# Patient Record
Sex: Female | Born: 1975 | Race: White | Hispanic: No | Marital: Single | Smoking: Current every day smoker
Health system: Southern US, Community
[De-identification: ages and names within clinical notes are randomized; demographics above are authoritative.]

---

## 2018-03-27 ENCOUNTER — Ambulatory Visit: Payer: Self-pay | Admitting: "Endocrinology

## 2018-05-19 ENCOUNTER — Ambulatory Visit (INDEPENDENT_AMBULATORY_CARE_PROVIDER_SITE_OTHER): Payer: Self-pay | Admitting: "Endocrinology

## 2018-05-19 ENCOUNTER — Encounter: Payer: Self-pay | Admitting: "Endocrinology

## 2018-05-19 ENCOUNTER — Other Ambulatory Visit: Payer: Self-pay | Admitting: "Endocrinology

## 2018-05-19 ENCOUNTER — Other Ambulatory Visit: Payer: Self-pay

## 2018-05-19 VITALS — BP 118/76 | HR 79 | Ht 64.0 in | Wt 148.0 lb

## 2018-05-19 DIAGNOSIS — R7989 Other specified abnormal findings of blood chemistry: Secondary | ICD-10-CM

## 2018-05-19 NOTE — Progress Notes (Signed)
Endocrinology Consult Note                                            05/19/2018, 1:12 PM   Subjective:    Patient ID: Wendy Mckee, female    DOB: 31-Dec-1975, PCP Liam RogersAdkins, Neal A, MD   History reviewed. No pertinent past medical history. History reviewed. No pertinent surgical history. Social History   Socioeconomic History  . Marital status: Single    Spouse name: Not on file  . Number of children: Not on file  . Years of education: Not on file  . Highest education level: Not on file  Occupational History  . Not on file  Social Needs  . Financial resource strain: Not on file  . Food insecurity:    Worry: Not on file    Inability: Not on file  . Transportation needs:    Medical: Not on file    Non-medical: Not on file  Tobacco Use  . Smoking status: Current Every Day Smoker    Packs/day: 2.00    Years: 33.00    Pack years: 66.00    Types: Cigarettes  . Smokeless tobacco: Never Used  Substance and Sexual Activity  . Alcohol use: Yes    Comment: ocassional  . Drug use: Yes    Types: Marijuana  . Sexual activity: Not on file  Lifestyle  . Physical activity:    Days per week: Not on file    Minutes per session: Not on file  . Stress: Not on file  Relationships  . Social connections:    Talks on phone: Not on file    Gets together: Not on file    Attends religious service: Not on file    Active member of club or organization: Not on file    Attends meetings of clubs or organizations: Not on file    Relationship status: Not on file  Other Topics Concern  . Not on file  Social History Narrative  . Not on file   Family History  Problem Relation Age of Onset  . Diabetes Mother   . Diabetes Father   . Hypertension Father    Outpatient Encounter Medications as of 05/19/2018  Medication Sig  . citalopram (CELEXA) 10 MG tablet Take 10 mg by mouth daily.   No facility-administered encounter medications on file as of 05/19/2018.     ALLERGIES: Allergies  Allergen Reactions  . Penicillins     VACCINATION STATUS:  There is no immunization history on file for this patient.  HPI Wendy Mckee is 43 y.o. female who presents today with a medical history as above. she is being seen in consultation for abnormally suppressed TSH of < 0.006 requested by Liam RogersAdkins, Neal A, MD.  -She denies palpitations, tremors, heat/cold intolerance.  She denies any prior history of thyroid dysfunction.  She has distant cousins with an identified thyroid problem.  She has no family history of thyroid malignancy.  Patient is uninsured and worries about cost of medical care. -She denies recent weight change. Review of Systems  Constitutional: no recent weight gain/loss, + fatigue, no subjective hyperthermia, no subjective hypothermia Eyes: no blurry vision, no xerophthalmia ENT: no sore throat, no nodules palpated in throat, no dysphagia/odynophagia, no hoarseness Cardiovascular: no Chest Pain, no Shortness of Breath, no palpitations, no leg swelling Respiratory: no cough,  no shortness of breath Gastrointestinal: no Nausea/Vomiting/Diarhhea Musculoskeletal: no muscle/joint aches Skin: no rashes Neurological: no tremors, no numbness, no tingling, no dizziness Psychiatric: + depression, no anxiety  Objective:    BP 118/76   Pulse 79   Ht 5\' 4"  (1.626 m)   Wt 148 lb (67.1 kg)   BMI 25.40 kg/m   Wt Readings from Last 3 Encounters:  05/19/18 148 lb (67.1 kg)    Physical Exam  Constitutional: + appropriate weight for height, not in acute distress, normal state of mind Eyes: PERRLA, EOMI, no exophthalmos ENT: moist mucous membranes, + palpable  thyromegaly, no gross cervical lymphadenopathy Cardiovascular: normal precordial activity, Regular Rate and Rhythm, no Murmur/Rubs/Gallops Respiratory:  adequate breathing efforts, no gross chest deformity, Clear to auscultation bilaterally Gastrointestinal: abdomen soft, Non -tender, No  distension, Bowel Sounds present, no gross organomegaly Musculoskeletal: no gross deformities, strength intact in all four extremities Skin: moist, warm, no rashes Neurological: no tremor with outstretched hands, Deep tendon reflexes normal in bilateral lower extremities.   February 11, 2018 TSH <0.006 Assessment & Plan:   1. Abnormal thyroid blood test - [name]  is being seen at a kind request of Liam Rogers, MD. - I have reviewed her available thyroid records and clinically evaluated the patient. - Based on these reviews, she has abnormally suppressed TSH which may be an indication of clinically significant thyrotoxicosis. -Ideally, she would be considered for work-up with thyroid uptake and scan and repeat thyroid function tests.  However, patient does not have insurance and may not afford uptake and scan nor subsequent I-131 thyroid ablation.  She will be sent for thyroid function test and return in 10 days for reevaluation. -If she is confirmed to have primary hyperthyroidism, she will be considered for treatment with methimazole while waiting for her to obtain proper insurance.  Options of treatment were briefly discussed with the patient. -She does not have tachycardia, currently pulse rate is 79. - I did not initiate any new prescriptions today. - I advised her  to maintain close follow up with Liam Rogers, MD for primary care needs.   - Time spent with the patient: 45 minutes, of which >50% was spent in obtaining information about her symptoms, reviewing her previous labs/studies,  evaluations, and treatments, counseling her about her abnormal thyroid function test, and developing a plan to confirm the diagnosis and long term treatment based on the latest standards of care/guidelines.    Wendy Alexanders participated in the discussions, expressed understanding, and voiced agreement with the above plans.  All questions were answered to her satisfaction. she is encouraged to  contact clinic should she have any questions or concerns prior to her return visit.  Follow up plan: Return in about 10 days (around 05/29/2018) for Follow up with Pre-visit Labs.   Marquis Lunch, MD [facility] [address] [place], [place] [address] Phone: [phone]  Fax: [phone]     05/19/2018, 1:12 PM  This note was partially dictated with voice recognition software. Similar sounding words can be transcribed inadequately or may not  be corrected upon review.

## 2018-05-20 LAB — T3: T3, Total: 186 ng/dL — ABNORMAL HIGH (ref 71–180)

## 2018-05-20 LAB — TSH: TSH: <0.006 u[IU]/mL — ABNORMAL LOW (ref 0.450–4.500)

## 2018-05-20 LAB — T4, FREE: Free T4: 1.96 ng/dL — ABNORMAL HIGH (ref 0.82–1.77)

## 2018-05-20 LAB — THYROGLOBULIN ANTIBODY: Thyroglobulin Antibody: <1 IU/mL (ref 0.0–0.9)

## 2018-05-20 LAB — THYROID PEROXIDASE ANTIBODY: Thyroperoxidase Ab SerPl-aCnc: 120 IU/mL — ABNORMAL HIGH (ref 0–34)

## 2018-05-29 ENCOUNTER — Encounter: Payer: Self-pay | Admitting: "Endocrinology

## 2018-05-29 ENCOUNTER — Other Ambulatory Visit: Payer: Self-pay

## 2018-05-29 ENCOUNTER — Ambulatory Visit (INDEPENDENT_AMBULATORY_CARE_PROVIDER_SITE_OTHER): Payer: Self-pay | Admitting: "Endocrinology

## 2018-05-29 VITALS — BP 139/80 | HR 90 | Ht 64.0 in | Wt 146.0 lb

## 2018-05-29 DIAGNOSIS — E0590 Thyrotoxicosis, unspecified without thyrotoxic crisis or storm: Secondary | ICD-10-CM

## 2018-05-29 MED ORDER — PROPRANOLOL HCL 20 MG PO TABS: 20.0000 mg | ORAL_TABLET | Freq: Two times a day (BID) | ORAL | 2 refills | Status: DC

## 2018-05-29 NOTE — Progress Notes (Signed)
Endocrinology follow-up note                                            05/29/2018, 1:30 PM   Subjective:    Patient ID: Wendy Mckee, female    DOB: 07-30-1975, PCP Liam RogersAdkins, Neal A, MD   History reviewed. No pertinent past medical history. History reviewed. No pertinent surgical history. Social History   Socioeconomic History  . Marital status: Single    Spouse name: Not on file  . Number of children: Not on file  . Years of education: Not on file  . Highest education level: Not on file  Occupational History  . Not on file  Social Needs  . Financial resource strain: Not on file  . Food insecurity:    Worry: Not on file    Inability: Not on file  . Transportation needs:    Medical: Not on file    Non-medical: Not on file  Tobacco Use  . Smoking status: Current Every Day Smoker    Packs/day: 2.00    Years: 33.00    Pack years: 66.00    Types: Cigarettes  . Smokeless tobacco: Never Used  Substance and Sexual Activity  . Alcohol use: Yes    Comment: ocassional  . Drug use: Yes    Types: Marijuana  . Sexual activity: Not on file  Lifestyle  . Physical activity:    Days per week: Not on file    Minutes per session: Not on file  . Stress: Not on file  Relationships  . Social connections:    Talks on phone: Not on file    Gets together: Not on file    Attends religious service: Not on file    Active member of club or organization: Not on file    Attends meetings of clubs or organizations: Not on file    Relationship status: Not on file  Other Topics Concern  . Not on file  Social History Narrative  . Not on file   Family History  Problem Relation Age of Onset  . Diabetes Mother   . Diabetes Father   . Hypertension Father    Outpatient Encounter Medications as of 05/29/2018  Medication Sig  . citalopram (CELEXA) 10 MG tablet Take 10 mg by mouth daily.  . propranolol (INDERAL) 20 MG tablet Take 1 tablet (20 mg total) by mouth 2 (two) times daily.    No facility-administered encounter medications on file as of 05/29/2018.    ALLERGIES: Allergies  Allergen Reactions  . Penicillins     VACCINATION STATUS:  There is no immunization history on file for this patient.  HPI Wendy Mckee is 43 y.o. female who presents today with a medical history as above. she is being seen in consultation for abnormally suppressed TSH of < 0.006 requested by Liam RogersAdkins, Neal A, MD.  Her repeat thyroid function test confirms hyperthyroidism.  She now reports some mild palpitations, tremors, heat intolerance.   She has distant cousins with an identified thyroid problem.  She has no family history of thyroid malignancy.  Patient is uninsured and worries about cost of medical care. -She denies recent weight change.  Review of Systems  Constitutional: no major recent weight change,  + fatigue, no subjective hyperthermia, no subjective hypothermia Eyes: no blurry vision, no xerophthalmia ENT: no sore throat,  no nodules palpated in throat, no dysphagia/odynophagia, no hoarseness Cardiovascular: no Chest Pain, no Shortness of Breath, no palpitations, no leg swelling Respiratory: no cough, no shortness of breath Gastrointestinal: no Nausea/Vomiting/Diarhhea Musculoskeletal: no muscle/joint aches Skin: no rashes Neurological: no tremors, no numbness, no tingling, no dizziness Psychiatric: + depression, no anxiety  Objective:    BP 139/80   Pulse 90   Ht 5\' 4"  (1.626 m)   Wt 146 lb (66.2 kg)   BMI 25.06 kg/m   Wt Readings from Last 3 Encounters:  05/29/18 146 lb (66.2 kg)  05/19/18 148 lb (67.1 kg)    Physical Exam  Constitutional: + appropriate weight for height, not in acute distress, normal state of mind Eyes: PERRLA, EOMI, no exophthalmos ENT: moist mucous membranes, + palpable  thyromegaly, no gross cervical lymphadenopathy Cardiovascular: normal precordial activity, Regular Rate and Rhythm, no Murmur/Rubs/Gallops Respiratory:  adequate  breathing efforts, no gross chest deformity, Clear to auscultation bilaterally Gastrointestinal: abdomen soft, Non -tender, No distension, Bowel Sounds present, no gross organomegaly Musculoskeletal: no gross deformities, strength intact in all four extremities Skin: moist, warm, no rashes Neurological: no tremor with outstretched hands, Deep tendon reflexes normal in bilateral lower extremities.  Recent Results (from the past 2160 hour(s))  T4, free     Status: Abnormal   Collection Time: 05/19/18  2:04 PM  Result Value Ref Range   Free T4 1.96 (H) 0.82 - 1.77 ng/dL  TSH     Status: Abnormal   Collection Time: 05/19/18  2:04 PM  Result Value Ref Range   TSH <0.006 (L) 0.450 - 4.500 uIU/mL  T3     Status: Abnormal   Collection Time: 05/19/18  2:04 PM  Result Value Ref Range   T3, Total 186 (H) 71 - 180 ng/dL  Thyroglobulin antibody     Status: None   Collection Time: 05/19/18  2:04 PM  Result Value Ref Range   Thyroglobulin Antibody <1.0 0.0 - 0.9 IU/mL    Comment: Thyroglobulin Antibody measured by Beckman Coulter Methodology  Thyroid peroxidase antibody     Status: Abnormal   Collection Time: 05/19/18  2:04 PM  Result Value Ref Range   Thyroperoxidase Ab SerPl-aCnc 120 (H) 0 - 34 IU/mL    February 11, 2018 TSH <0.006 Assessment & Plan:   1.  Hyperthyroidism : -Her repeat thyroid function tests confirm hyperthyroidism.  Ideally, she will need thyroid uptake and scan to confirm diagnosis and subsequent I-131 thyroid ablation.  She does not have proper medical insurance, will try to obtain discount programs to get the scan done.  -She will initiate the phone call and work-up process today and will be seen again in 1 week with scan results.I had a discussion with her if she does not get any help with the best  diagnostic and therapeutic options, she will be treated with methimazole. -She is being given prescription for propanolol 20 mg p.o. twice daily for symptomatic control.  -  I advised her  to maintain close follow up with Liam Rogers, MD for primary care needs. Time for this visit 15 minutes.  Wendy Alexanders participated in the discussions, expressed understanding, and voiced agreement with the above plans.  All questions were answered to her satisfaction. she is encouraged to contact clinic should she have any questions or concerns prior to her return visit.   Follow up plan: Return in about 1 week (around 06/05/2018) for Follow up with Thyroid Uptake and Scan.   Marquis Lunch,  MD [facility] [address] [place], [place] [address] Phone: [phone]  Fax: [phone]     05/29/2018, 1:30 PM  This note was partially dictated with voice recognition software. Similar sounding words can be transcribed inadequately or may not  be corrected upon review.

## 2018-06-06 ENCOUNTER — Ambulatory Visit (INDEPENDENT_AMBULATORY_CARE_PROVIDER_SITE_OTHER): Payer: Self-pay | Admitting: "Endocrinology

## 2018-06-06 ENCOUNTER — Other Ambulatory Visit: Payer: Self-pay

## 2018-06-06 ENCOUNTER — Encounter: Payer: Self-pay | Admitting: "Endocrinology

## 2018-06-06 DIAGNOSIS — E0590 Thyrotoxicosis, unspecified without thyrotoxic crisis or storm: Secondary | ICD-10-CM

## 2018-06-06 MED ORDER — METHIMAZOLE 10 MG PO TABS: 10.0000 mg | ORAL_TABLET | Freq: Every day | ORAL | 1 refills | Status: AC

## 2018-06-06 NOTE — Progress Notes (Signed)
06/06/2018, 12:34 PM                                Endocrinology Telehealth Visit Follow up Note -During COVID -19 Pandemic  I connected with Jill Alexanders on 06/06/2018   by telephone and verified that I am speaking with the correct person using two identifiers. Jill Alexanders, Jan 16, 1975. she has verbally consented to this visit. All issues noted in this document were discussed and addressed. The format was not optimal for physical exam.    Subjective:    Patient ID: Jill Alexanders, female    DOB: 1975-07-29, PCP Liam Rogers, MD   History reviewed. No pertinent past medical history. History reviewed. No pertinent surgical history. Social History   Socioeconomic History  . Marital status: Single    Spouse name: Not on file  . Number of children: Not on file  . Years of education: Not on file  . Highest education level: Not on file  Occupational History  . Not on file  Social Needs  . Financial resource strain: Not on file  . Food insecurity:    Worry: Not on file    Inability: Not on file  . Transportation needs:    Medical: Not on file    Non-medical: Not on file  Tobacco Use  . Smoking status: Current Every Day Smoker    Packs/day: 2.00    Years: 33.00    Pack years: 66.00    Types: Cigarettes  . Smokeless tobacco: Never Used  Substance and Sexual Activity  . Alcohol use: Yes    Comment: ocassional  . Drug use: Yes    Types: Marijuana  . Sexual activity: Not on file  Lifestyle  . Physical activity:    Days per week: Not on file    Minutes per session: Not on file  . Stress: Not on file  Relationships  . Social connections:    Talks on phone: Not on file    Gets together: Not on file    Attends religious service: Not on file    Active member of club or organization: Not on file    Attends meetings of clubs or organizations: Not on file    Relationship status: Not on file  Other Topics Concern  . Not on file   Social History Narrative  . Not on file   Family History  Problem Relation Age of Onset  . Diabetes Mother   . Diabetes Father   . Hypertension Father    Outpatient Encounter Medications as of 06/06/2018  Medication Sig  . citalopram (CELEXA) 10 MG tablet Take 10 mg by mouth daily.  . methimazole (TAPAZOLE) 10 MG tablet Take 1 tablet (10 mg total) by mouth daily.  . propranolol (INDERAL) 20 MG tablet Take 1 tablet (20 mg total) by mouth 2 (two) times daily.   No facility-administered encounter medications on file as of 06/06/2018.    ALLERGIES: Allergies  Allergen Reactions  . Penicillins     VACCINATION STATUS:  There is no immunization history on file for this patient.  HPI SHAKTI KETCHAM is 43 y.o. female who  presents today with a medical history as above. she is being engaged in telehealth for f/u of hyperthyroidism. She did not obtain coverage for thyroid uptake and scan.   Her repeat thyroid function test confirms hyperthyroidism.  She still reports some mild palpitations, tremors, heat intolerance.   She has distant cousins with an identified thyroid problem.  She has no family history of thyroid malignancy.  Patient is uninsured and worries about cost of medical care. -She denies recent weight change.  Review of Systems  Constitutional: no major recent weight change,  + fatigue, no subjective hyperthermia, no subjective hypothermia Eyes: no blurry vision, no xerophthalmia ENT: no sore throat, no nodules palpated in throat, no dysphagia/odynophagia, no hoarseness Cardiovascular: no Chest Pain, no Shortness of Breath, no palpitations, no leg swelling Respiratory: no cough, no shortness of breath Gastrointestinal: no Nausea/Vomiting/Diarhhea Musculoskeletal: no muscle/joint aches Skin: no rashes Neurological: no tremors, no numbness, no tingling, no dizziness Psychiatric: + depression, no anxiety  Objective:    There were no vitals taken for this visit.  Wt Readings  from Last 3 Encounters:  05/29/18 146 lb (66.2 kg)  05/19/18 148 lb (67.1 kg)    Physical Exam    Recent Results (from the past 2160 hour(s))  T4, free     Status: Abnormal   Collection Time: 05/19/18  2:04 PM  Result Value Ref Range   Free T4 1.96 (H) 0.82 - 1.77 ng/dL  TSH     Status: Abnormal   Collection Time: 05/19/18  2:04 PM  Result Value Ref Range   TSH <0.006 (L) 0.450 - 4.500 uIU/mL  T3     Status: Abnormal   Collection Time: 05/19/18  2:04 PM  Result Value Ref Range   T3, Total 186 (H) 71 - 180 ng/dL  Thyroglobulin antibody     Status: None   Collection Time: 05/19/18  2:04 PM  Result Value Ref Range   Thyroglobulin Antibody <1.0 0.0 - 0.9 IU/mL    Comment: Thyroglobulin Antibody measured by Beckman Coulter Methodology  Thyroid peroxidase antibody     Status: Abnormal   Collection Time: 05/19/18  2:04 PM  Result Value Ref Range   Thyroperoxidase Ab SerPl-aCnc 120 (H) 0 - 34 IU/mL    February 11, 2018 TSH <0.006 Assessment & Plan:   1.  Hyperthyroidism : -she has symptomatic hyperthyroidism, can not afford  thyroid uptake and scan  and  I-131 thyroid ablation.  She does not have proper medical insurance. She is offered treatment with tapazole 10mg  po qday, until she obtains proper coverage.  -She is advised to continue  propanolol 20 mg p.o. twice daily for symptomatic control.  - I advised her  to maintain close follow up with Liam RogersAdkins, Neal A, MD for primary care needs. Time for this visit 15 minutes.  Jill Alexandersina S Ent participated in the discussions, expressed understanding, and voiced agreement with the above plans.  All questions were answered to her satisfaction. she is encouraged to contact clinic should she have any questions or concerns prior to her return visit.   Follow up plan: Return in about 9 weeks (around 08/08/2018) for Follow up with Pre-visit Labs.   Marquis LunchGebre Bernice Mcauliffe, MD [facility] [address] [place], [place] [address] Phone: [phone]  Fax: [phone]     06/06/2018, 12:34 PM  This note was partially dictated with voice recognition software. Similar sounding words can be transcribed inadequately or may not  be corrected upon review.

## 2018-07-01 ENCOUNTER — Telehealth: Payer: Self-pay | Admitting: "Endocrinology

## 2018-07-01 NOTE — Telephone Encounter (Signed)
Patient said she scheduled her thyroid uptake scan for July 20th. They want her off of her methimazole (TAPAZOLE) 10 MG tablet for 2 weeks prior. She wants to know if that is okay?

## 2018-07-01 NOTE — Telephone Encounter (Signed)
Pt.notified

## 2018-07-21 NOTE — Telephone Encounter (Signed)
Patient said she does not have insurance so she applied for care credit. She went to do her scan this morning and they do not accept care credit so they canceled her scan. She wants to know if [facility] accepts it, if so she wants to do it at [facility]

## 2018-08-08 ENCOUNTER — Ambulatory Visit: Payer: Self-pay | Admitting: "Endocrinology

## 2018-08-18 ENCOUNTER — Encounter (HOSPITAL_COMMUNITY)
Admission: RE | Admit: 2018-08-18 | Discharge: 2018-08-18 | Disposition: A | Discharge status: Home / Self Care | Payer: Self-pay | Source: Ambulatory Visit | Attending: "Endocrinology | Admitting: "Endocrinology

## 2018-08-18 ENCOUNTER — Other Ambulatory Visit: Payer: Self-pay

## 2018-08-18 DIAGNOSIS — E0590 Thyrotoxicosis, unspecified without thyrotoxic crisis or storm: Secondary | ICD-10-CM | POA: Insufficient documentation

## 2018-08-18 IMAGING — NM NUCLEAR MEDICINE THYROID UPTAKE (4 AND 24 HOUR) AND SCAN
4 series · 4 of 4 positions shown · non-contrast
Comparison: None.

CLINICAL DATA: Hyperthyroidism. TSH equal 0.006. Symptoms include
nervousness, weight loss, heat intolerance, difficulty sleeping,
hand tremors, heart palpitations.

EXAM:
THYROID SCAN AND UPTAKE - 4 AND 24 HOURS
TECHNIQUE: Following oral administration of [VL] capsule, anterior planar
imaging was acquired at 24 hours. Thyroid uptake was calculated with
a thyroid probe at 4-6 hours and 24 hours.
RADIOPHARMACEUTICALS:  303 uCi [VL] sodium iodide p.o.

[Series 1: ant w marker · 1.18mm/px · 1 of 1 slices shown]
[im 1/1  full-range]
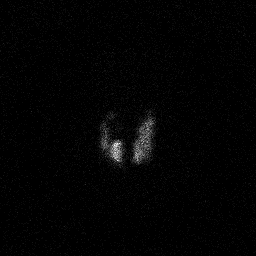

[Series 2: anterior · 1.18mm/px · 1 of 1 slices shown]
[im 1/1  full-range]
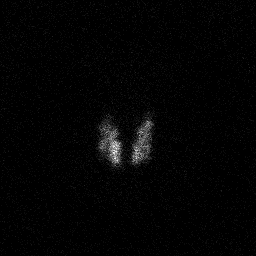

[Series 3: lao · 1.18mm/px · 1 of 1 slices shown]
[im 1/1]
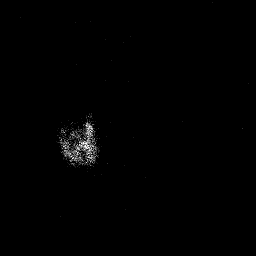

[Series 4: rao · 1.18mm/px · 1 of 1 slices shown]
[im 1/1  full-range]
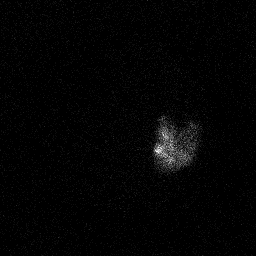

[4 of 4 positions shown; findings below may reference images not displayed]

FINDINGS: Thyroid gland appears relatively normal volume. There is nodular
appearance of the RIGHT lobe of thyroid gland with a warm nodule in
the inferior medial aspect of the RIGHT lobe and potential cold
nodule in the mid and upper pole the RIGHT gland medially.

4 hour [VL] uptake = 14.9% (normal 5-20%)

24 hour [VL] uptake = 26.5% (normal 10-30%)
IMPRESSION: 1. Findings consistent with toxic multinodular goiter.
2. Potential cold nodule on the RIGHT. Consider thyroid ultrasound
prior to potential I 131 therapy for hyperthyroidism.

## 2018-08-18 MED ORDER — SODIUM IODIDE I-123 7.4 MBQ CAPS
303.0000 | ORAL_CAPSULE | Freq: Once | ORAL | Status: AC
  Administered 2018-08-18: 09:00:00 303 via ORAL

## 2018-08-19 ENCOUNTER — Encounter (HOSPITAL_COMMUNITY)
Admission: RE | Admit: 2018-08-19 | Discharge: 2018-08-19 | Disposition: A | Discharge status: Home / Self Care | Payer: Self-pay | Source: Ambulatory Visit | Attending: "Endocrinology | Admitting: "Endocrinology

## 2018-08-20 ENCOUNTER — Other Ambulatory Visit: Payer: Self-pay | Admitting: "Endocrinology

## 2018-08-20 DIAGNOSIS — E0590 Thyrotoxicosis, unspecified without thyrotoxic crisis or storm: Secondary | ICD-10-CM

## 2018-08-20 DIAGNOSIS — R7989 Other specified abnormal findings of blood chemistry: Secondary | ICD-10-CM

## 2018-08-20 DIAGNOSIS — E042 Nontoxic multinodular goiter: Secondary | ICD-10-CM

## 2018-08-24 ENCOUNTER — Other Ambulatory Visit: Payer: Self-pay | Admitting: "Endocrinology

## 2018-08-27 ENCOUNTER — Telehealth: Payer: Self-pay | Admitting: "Endocrinology

## 2018-08-27 ENCOUNTER — Ambulatory Visit: Payer: Self-pay | Admitting: "Endocrinology

## 2018-08-27 NOTE — Telephone Encounter (Signed)
Noted. That makes it necessary for her to get the u/s done ASAP, so that she will be treated with i131. She has to get labs if she feels palpitations, rapid weight loss, sweating. etc

## 2018-08-27 NOTE — Telephone Encounter (Signed)
Pt has rescheduled. She states she needs to wait 90 days to come back in due to not having any insurance. She has however quit taking the methimazole. She states it made her feel dizzy, nauseous. She states she is not going to take this medication.

## 2018-08-27 NOTE — Telephone Encounter (Signed)
Pt.notified

## 2018-12-23 ENCOUNTER — Other Ambulatory Visit: Payer: Self-pay | Admitting: "Endocrinology

## 2021-05-18 ENCOUNTER — Telehealth: Payer: Self-pay | Admitting: "Endocrinology

## 2021-05-18 NOTE — Telephone Encounter (Signed)
Pt called to be seen, she has not been seen since 2020. I asked her to get updated office notes/ labs and have them sent to [place] then we would call her to sch
